# Patient Record
Sex: Male | Born: 1950 | Race: Asian | Hispanic: No | Marital: Married | Smoking: Never smoker
Health system: Southern US, Community
[De-identification: ages and names within clinical notes are randomized; demographics above are authoritative.]

## PROBLEM LIST (undated history)

## (undated) DIAGNOSIS — I10 Essential (primary) hypertension: Secondary | ICD-10-CM

---

## 2002-12-16 ENCOUNTER — Emergency Department (HOSPITAL_COMMUNITY): Admission: EM | Admit: 2002-12-16 | Discharge: 2002-12-16 | Payer: Self-pay | Admitting: Emergency Medicine

## 2006-09-17 ENCOUNTER — Emergency Department (HOSPITAL_COMMUNITY): Admission: EM | Admit: 2006-09-17 | Discharge: 2006-09-18 | Payer: Self-pay | Admitting: Emergency Medicine

## 2010-11-02 LAB — I-STAT 8, (EC8 V) (CONVERTED LAB)
BUN: 20
Bicarbonate: 24.3 — ABNORMAL HIGH
Chloride: 105
Glucose, Bld: 114 — ABNORMAL HIGH
HCT: 43
Hemoglobin: 14.6
Operator id: 277751
Potassium: 3.9
Sodium: 138
TCO2: 25
pCO2, Ven: 38.2 — ABNORMAL LOW
pH, Ven: 7.412 — ABNORMAL HIGH

## 2010-11-02 LAB — URINALYSIS, ROUTINE W REFLEX MICROSCOPIC
Bilirubin Urine: NEGATIVE
Glucose, UA: NEGATIVE
Hgb urine dipstick: NEGATIVE
Ketones, ur: NEGATIVE
Nitrite: NEGATIVE
Protein, ur: NEGATIVE
Specific Gravity, Urine: 1.015
Urobilinogen, UA: 0.2
pH: 6.5

## 2010-11-02 LAB — DIFFERENTIAL
Basophils Absolute: 0
Basophils Relative: 0
Eosinophils Absolute: 0.1
Eosinophils Relative: 1
Lymphocytes Relative: 12
Lymphs Abs: 1.1
Monocytes Absolute: 0.5
Monocytes Relative: 5
Neutro Abs: 8.1 — ABNORMAL HIGH
Neutrophils Relative %: 83 — ABNORMAL HIGH

## 2010-11-02 LAB — CBC
HCT: 39.5
Hemoglobin: 13.8
MCHC: 34.9
MCV: 85.9
Platelets: 209
RBC: 4.6
RDW: 12.7
WBC: 9.8

## 2010-11-02 LAB — POCT I-STAT CREATININE
Creatinine, Ser: 0.9
Operator id: 277751

## 2011-01-18 ENCOUNTER — Other Ambulatory Visit: Payer: Self-pay | Admitting: Internal Medicine

## 2011-01-18 ENCOUNTER — Ambulatory Visit
Admission: RE | Admit: 2011-01-18 | Discharge: 2011-01-18 | Disposition: A | Discharge status: Home / Self Care | Payer: Worker's Compensation | Source: Ambulatory Visit | Attending: Internal Medicine | Admitting: Internal Medicine

## 2011-01-18 DIAGNOSIS — R609 Edema, unspecified: Secondary | ICD-10-CM

## 2014-07-19 ENCOUNTER — Ambulatory Visit: Payer: Worker's Compensation | Admitting: Physician Assistant

## 2014-07-20 ENCOUNTER — Telehealth: Payer: Self-pay | Admitting: Physician Assistant

## 2014-07-20 ENCOUNTER — Encounter: Payer: Self-pay | Admitting: Physician Assistant

## 2014-07-20 NOTE — Telephone Encounter (Signed)
Pt was no show 07/19/14 2:30pm, new pt appt, pt has not rescheduled, mailing letter, charge?

## 2014-07-20 NOTE — Telephone Encounter (Signed)
Do not charge. Do not reschedule with this provider due to NS for NP appointment.

## 2019-05-14 ENCOUNTER — Emergency Department (HOSPITAL_COMMUNITY): Payer: Medicare Other

## 2019-05-14 ENCOUNTER — Encounter (HOSPITAL_COMMUNITY): Payer: Self-pay

## 2019-05-14 ENCOUNTER — Emergency Department (HOSPITAL_COMMUNITY)
Admission: EM | Admit: 2019-05-14 | Discharge: 2019-05-14 | Disposition: A | Discharge status: Home / Self Care | Payer: Medicare Other | Attending: Emergency Medicine | Admitting: Emergency Medicine

## 2019-05-14 DIAGNOSIS — M62838 Other muscle spasm: Secondary | ICD-10-CM | POA: Insufficient documentation

## 2019-05-14 DIAGNOSIS — M549 Dorsalgia, unspecified: Secondary | ICD-10-CM | POA: Diagnosis present

## 2019-05-14 DIAGNOSIS — M542 Cervicalgia: Secondary | ICD-10-CM | POA: Insufficient documentation

## 2019-05-14 DIAGNOSIS — I10 Essential (primary) hypertension: Secondary | ICD-10-CM | POA: Diagnosis not present

## 2019-05-14 DIAGNOSIS — M62830 Muscle spasm of back: Secondary | ICD-10-CM

## 2019-05-14 HISTORY — DX: Essential (primary) hypertension: I10

## 2019-05-14 MED ORDER — DICLOFENAC SODIUM 1 % EX GEL: 2.0000 g | Freq: Four times a day (QID) | CUTANEOUS | 0 refills | Status: AC

## 2019-05-14 NOTE — ED Provider Notes (Signed)
[facility]-EMERGENCY DEPT Provider Note   CSN: [id number] Arrival date & time: 05/14/19  1112     History Chief Complaint  Patient presents with  . Back Pain    Allen Edwards is a 69 y.o. male.  69 year old male presents with complaint of right upper back pain onset 2 days ago upon waking.  Patient states pain is worse with leaning over to pick something up or turning his head to the left or right as well as movement of the right shoulder.  Patient took Motrin and applied some sort of patch to his upper back with improvement in his symptoms today.  Patient states he still has some discomfort when he turns his head left or right.  Denies chest pain, dizziness, extremity weakness or numbness.  No history of falls or injuries.  No other complaints or concerns.        Past Medical History:  Diagnosis Date  . Hypertension     There are no problems to display for this patient.   History reviewed. No pertinent surgical history.     History reviewed. No pertinent family history.  Social History   Tobacco Use  . Smoking status: Never Smoker  . Smokeless tobacco: Never Used  Substance Use Topics  . Alcohol use: Never  . Drug use: Never    Home Medications Prior to Admission medications   Medication Sig Start Date End Date Taking? Authorizing Provider  diclofenac Sodium (VOLTAREN) 1 % GEL Apply 2 g topically 4 (four) times daily for 10 days. 05/14/19 05/24/19  Jeannie Fend, PA-C    Allergies    Patient has no known allergies.  Review of Systems   Review of Systems  Constitutional: Negative for fever.  Respiratory: Negative for shortness of breath.   Cardiovascular: Negative for chest pain.  Musculoskeletal: Positive for back pain, myalgias and neck pain. Negative for arthralgias, gait problem and joint swelling.  Skin: Negative for rash and wound.  Neurological: Negative for weakness and numbness.    Physical Exam Updated Vital Signs BP (!)  175/101 (BP Location: Right Arm)   Pulse 64   Temp 98.2 F (36.8 C) (Oral)   Resp 18   SpO2 96%   Physical Exam Vitals and nursing note reviewed.  Constitutional:      General: He is not in acute distress.    Appearance: He is well-developed. He is not diaphoretic.  HENT:     Head: Normocephalic and atraumatic.  Cardiovascular:     Pulses: Normal pulses.  Pulmonary:     Effort: Pulmonary effort is normal.  Musculoskeletal:        General: Tenderness present. No swelling, deformity or signs of injury. Normal range of motion.     Cervical back: Normal range of motion. Tenderness present. No bony tenderness.     Thoracic back: Tenderness present. No bony tenderness.       Back:     Comments: TTP right paraspinous extending along medial boarder of the right scapula.   Skin:    General: Skin is warm and dry.     Capillary Refill: Capillary refill takes less than 2 seconds.     Findings: No erythema or rash.  Neurological:     Mental Status: He is alert and oriented to person, place, and time.     Sensory: Sensation is intact.     Motor: No weakness.  Psychiatric:        Behavior: Behavior normal.  ED Results / Procedures / Treatments   Labs (all labs ordered are listed, but only abnormal results are displayed) Labs Reviewed - No data to display  EKG None  Radiology DG Cervical Spine Complete  Result Date: 05/14/2019 CLINICAL DATA:  Posterior neck pain. No known injury. EXAM: CERVICAL SPINE - COMPLETE 4+ VIEW COMPARISON:  None. FINDINGS: Normal alignment of the cervical vertebral bodies. No acute bony findings or abnormal prevertebral soft tissue swelling. Mild degenerative disc disease and facet disease. The C1-2 articulations are maintained. The lung apices are clear. IMPRESSION: Normal alignment and no acute bony findings. Mild degenerative changes. Electronically Signed   By: Marijo Sanes M.D.   On: 05/14/2019 13:05    Procedures Procedures (including critical  care time)  Medications Ordered in ED Medications - No data to display  ED Course  I have reviewed the triage vital signs and the nursing notes.  Pertinent labs & imaging results that were available during my care of the patient were reviewed by me and considered in my medical decision making (see chart for details).  Clinical Course as of May 13 1332  Fri May 14, 2711  [id number] 69 year old male with complaint of pain in the right upper back x2 days without injury.  On exam has tenderness to right paraspinous extending into medial border of right scapula.  No arm weakness or numbness.  C-spine x-ray shows mild degenerative changes.  Will recommend patient continue on course of NSAIDs- will change from PO to topical Voltaren due to history of HTN, warm compresses, gentle stretching and follow-up with PCP.   [LM]    Clinical Course User Index [LM] Roque Lias   MDM Rules/Calculators/A&P                      Final Clinical Impression(s) / ED Diagnoses Final diagnoses:  Muscle spasm of back    Rx / DC Orders ED Discharge Orders         Ordered    diclofenac Sodium (VOLTAREN) 1 % GEL  4 times daily     05/14/19 1333           Roque Lias 05/14/19 1335    Sherwood Gambler, MD 05/18/19 [id number]

## 2019-05-14 NOTE — ED Triage Notes (Signed)
Pt presents with c/o upper right back pain for 3 days. Pt denies any injury.

## 2019-05-14 NOTE — Discharge Instructions (Addendum)
Follow up with your doctor- address and phone number added to discharge instructions today. STOP the Motrin by mouth due to history of hypertension. Recommend Topical Voltaren, given prescription for this. Also recommend warm compresses to area for 20 minutes three times daily.

## 2021-10-05 IMAGING — CR DG CERVICAL SPINE COMPLETE 4+V
8 series · 8 of 8 positions shown · non-contrast
Comparison: None.

CLINICAL DATA: Posterior neck pain. No known injury.

EXAM:
CERVICAL SPINE - COMPLETE 4+ VIEW

[w cervical spine lat (1 of 2)]
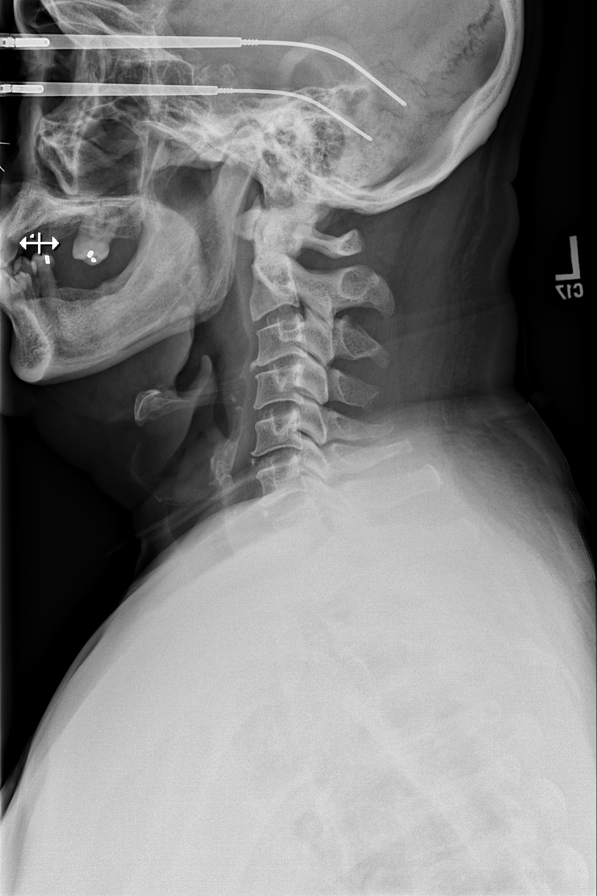

[w cervical spine lat (2 of 2)]
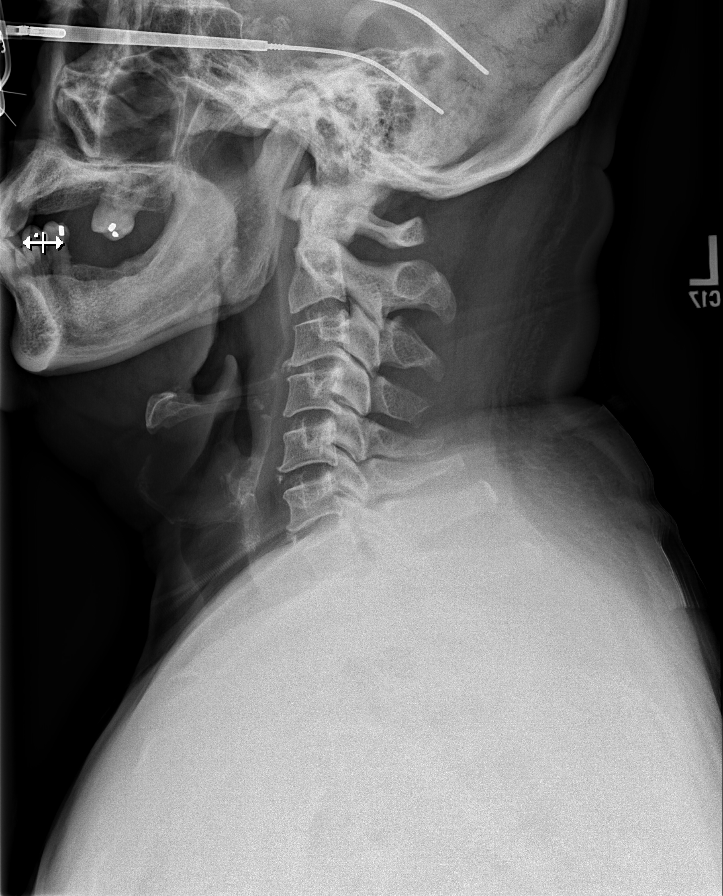

[w cervical spine ap_obl (1 of 3)]
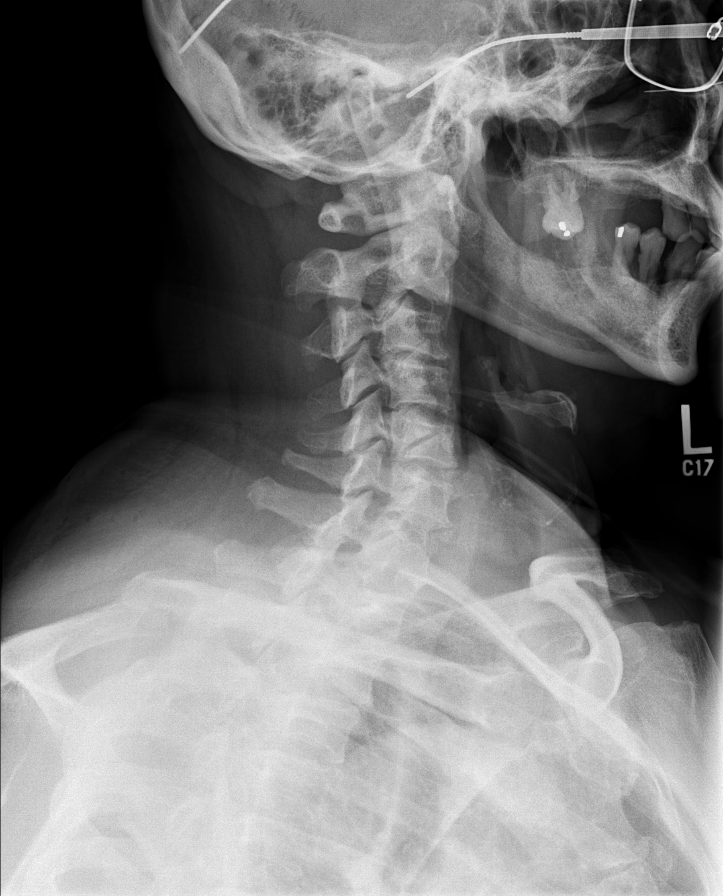

[w cervical spine ap_obl (2 of 3)]
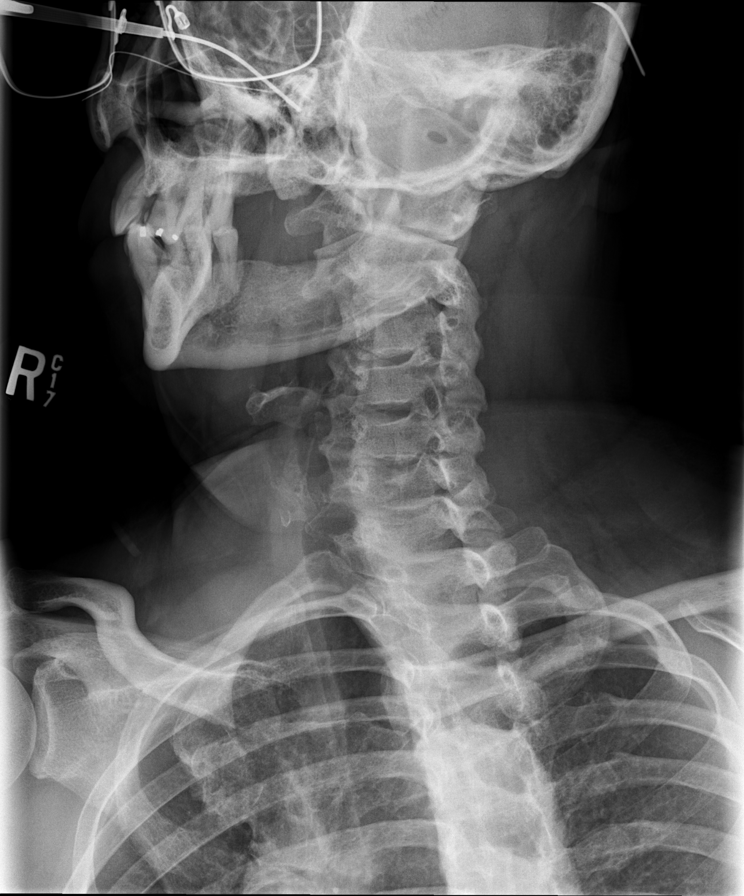

[w cervical spine ap_obl (3 of 3)]
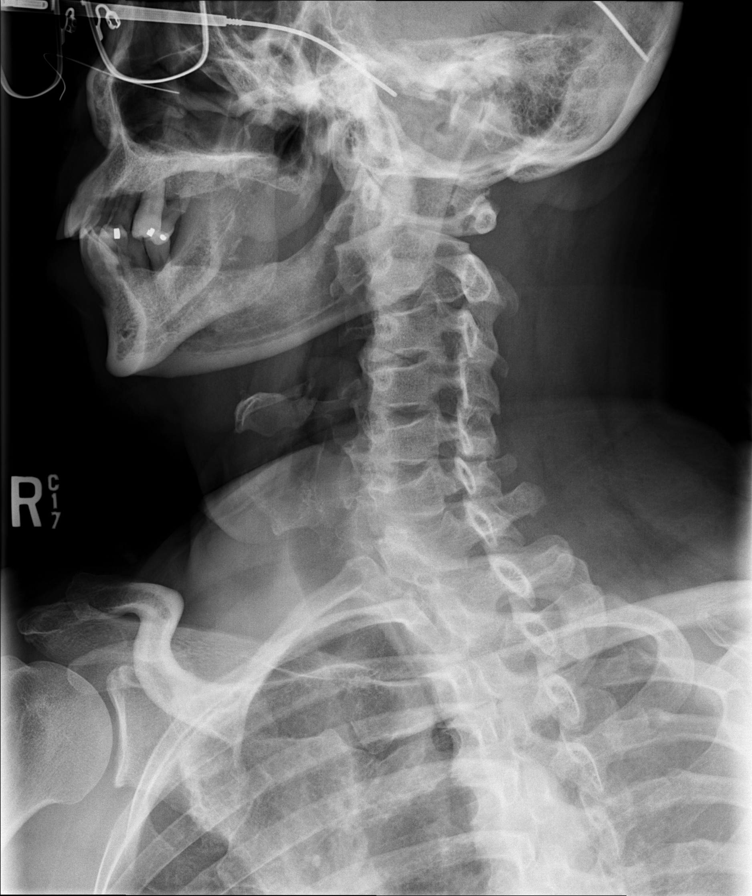

[w cervical spine ap]
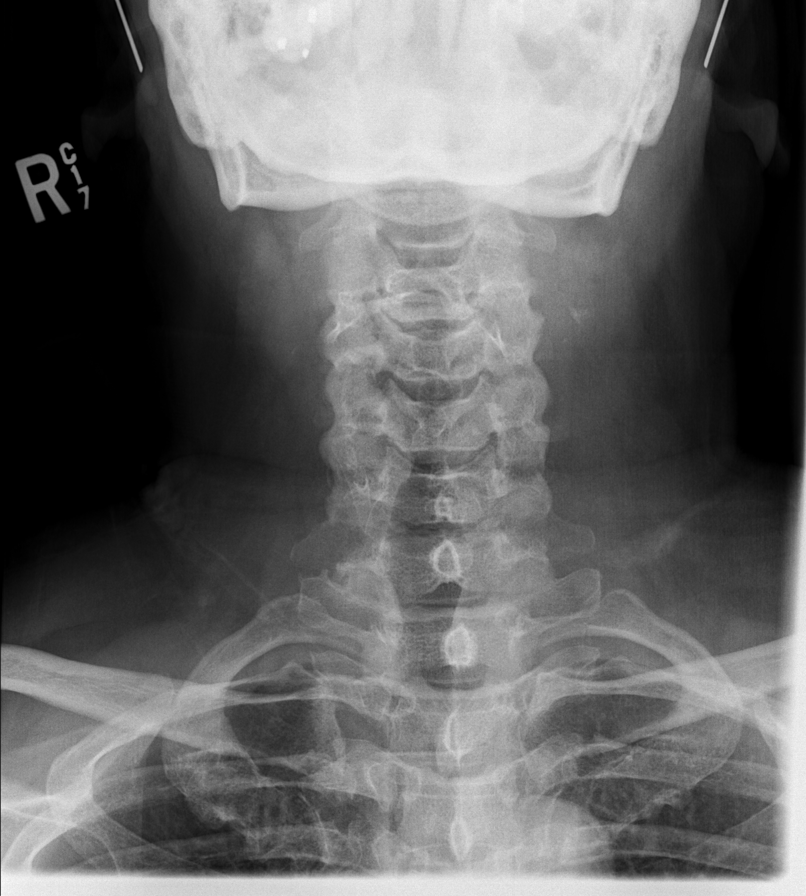

[w cervical spine odontoid]
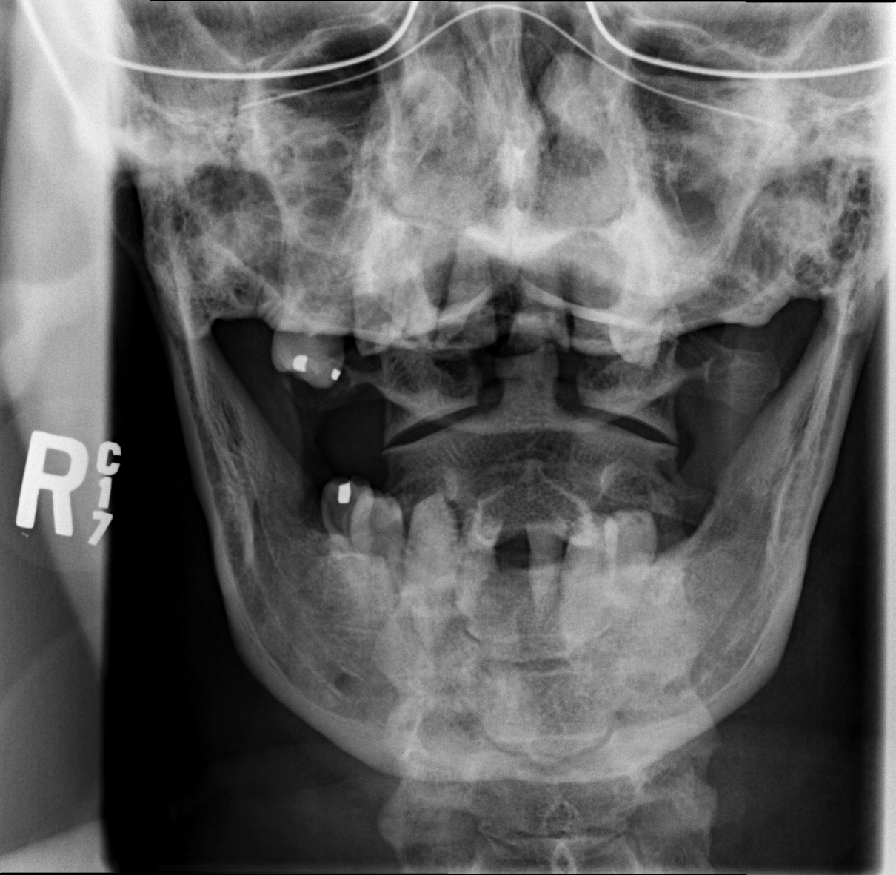

[w cervical swimmers]
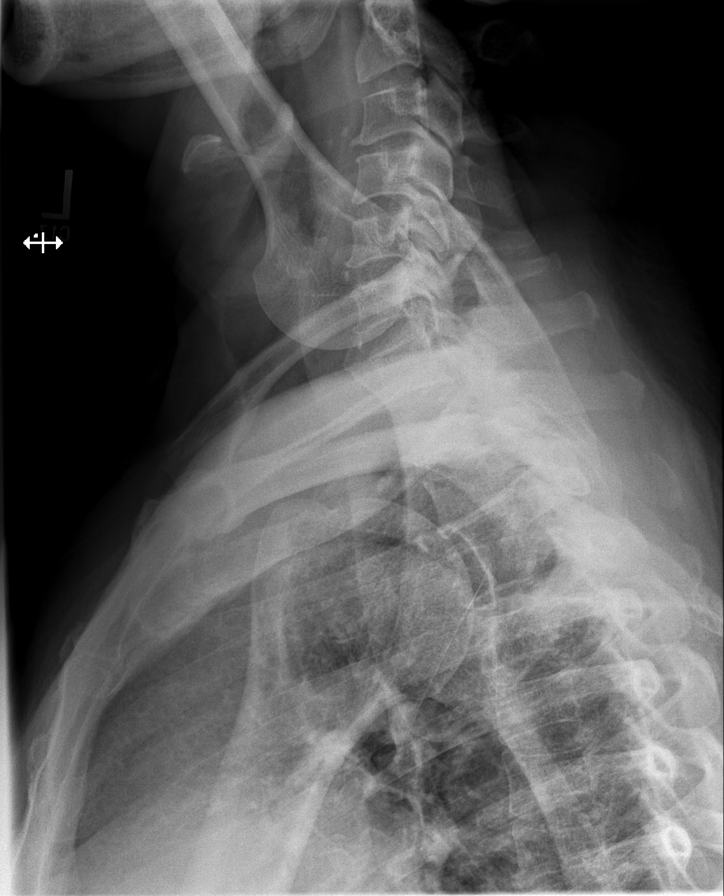

[8 of 8 positions shown; findings below may reference images not displayed]

FINDINGS: Normal alignment of the cervical vertebral bodies. No acute bony
findings or abnormal prevertebral soft tissue swelling.

Mild degenerative disc disease and facet disease.

The C1-2 articulations are maintained. The lung apices are clear.
IMPRESSION: Normal alignment and no acute bony findings.

Mild degenerative changes.

## 2022-07-14 ENCOUNTER — Encounter (HOSPITAL_COMMUNITY): Payer: Self-pay

## 2022-07-14 ENCOUNTER — Emergency Department (HOSPITAL_COMMUNITY)
Admission: EM | Admit: 2022-07-14 | Discharge: 2022-07-14 | Disposition: A | Discharge status: Home / Self Care | Payer: Medicare Other | Attending: Emergency Medicine | Admitting: Emergency Medicine

## 2022-07-14 ENCOUNTER — Other Ambulatory Visit: Payer: Self-pay

## 2022-07-14 DIAGNOSIS — I10 Essential (primary) hypertension: Secondary | ICD-10-CM | POA: Diagnosis not present

## 2022-07-14 DIAGNOSIS — Z23 Encounter for immunization: Secondary | ICD-10-CM | POA: Insufficient documentation

## 2022-07-14 DIAGNOSIS — S0183XA Puncture wound without foreign body of other part of head, initial encounter: Secondary | ICD-10-CM | POA: Diagnosis present

## 2022-07-14 MED ORDER — AMOXICILLIN-POT CLAVULANATE 875-125 MG PO TABS: 1.0000 | ORAL_TABLET | Freq: Two times a day (BID) | ORAL | 0 refills | Status: AC

## 2022-07-14 MED ORDER — OXYCODONE HCL 5 MG PO TABS
5.0000 mg | ORAL_TABLET | Freq: Once | ORAL | Status: AC
  Administered 2022-07-14: 5 mg via ORAL
  Filled 2022-07-14: qty 1

## 2022-07-14 MED ORDER — ACETAMINOPHEN 500 MG PO TABS
1000.0000 mg | ORAL_TABLET | Freq: Once | ORAL | Status: AC
  Administered 2022-07-14: 1000 mg via ORAL
  Filled 2022-07-14: qty 2

## 2022-07-14 MED ORDER — TETANUS-DIPHTH-ACELL PERTUSSIS 5-2.5-18.5 LF-MCG/0.5 IM SUSY
0.5000 mL | PREFILLED_SYRINGE | Freq: Once | INTRAMUSCULAR | Status: AC
  Administered 2022-07-14: 0.5 mL via INTRAMUSCULAR
  Filled 2022-07-14: qty 0.5

## 2022-07-14 MED ORDER — LIDOCAINE-EPINEPHRINE (PF) 2 %-1:200000 IJ SOLN
10.0000 mL | Freq: Once | INTRAMUSCULAR | Status: AC
  Administered 2022-07-14: 10 mL via INTRADERMAL
  Filled 2022-07-14: qty 20

## 2022-07-14 NOTE — Discharge Instructions (Addendum)
Your blood pressure was evaded here.  Please mention this to your family doctor.  Have them check it in the office when you are not in a very stressful situation.  Return for redness drainage or if you get a fever.  The sutures that were used are dissolvable that should dissolve between day 3 and day 5.  If they are still there after day 7 then you can gently plucked them out with tweezers.  The area can get wet but not fully immersed underwater.  No scrubbing.  If you really want to clean it you can apply a half-and-half hydrogen peroxide solution with water on a Q-tip.  You can apply an ointment a couple times a day this could be as simple as Vaseline but could also be an antibiotic ointment if you wish.  Once it is healed please try to avoid prolonged sun exposure use sunscreen.  Gells that have silicone antigens have been shown to reduce scarring in some research.

## 2022-07-14 NOTE — ED Notes (Signed)
Discharge instructions reviewed with patient. Patient questions answered and opportunity for education reviewed. Patient voices understanding of discharge instructions with no further questions. Patient ambulatory with steady gait to lobby.  
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
## 2022-07-22 ENCOUNTER — Emergency Department (HOSPITAL_COMMUNITY)
Admission: EM | Admit: 2022-07-22 | Discharge: 2022-07-22 | Discharge status: Against Medical Advice | Payer: 59 | Attending: Emergency Medicine | Admitting: Emergency Medicine

## 2022-07-22 DIAGNOSIS — S01511D Laceration without foreign body of lip, subsequent encounter: Secondary | ICD-10-CM | POA: Diagnosis not present

## 2022-07-22 DIAGNOSIS — Z4802 Encounter for removal of sutures: Secondary | ICD-10-CM | POA: Diagnosis not present

## 2022-07-22 DIAGNOSIS — Z5321 Procedure and treatment not carried out due to patient leaving prior to being seen by health care provider: Secondary | ICD-10-CM | POA: Diagnosis not present

## 2022-07-22 DIAGNOSIS — X58XXXD Exposure to other specified factors, subsequent encounter: Secondary | ICD-10-CM | POA: Insufficient documentation

## 2022-07-22 NOTE — ED Notes (Signed)
Patient called for room x3. No response

## 2022-07-22 NOTE — ED Triage Notes (Signed)
Pt to ED for stitch removal, reports stitches placed x 1 week ago to left upper lip

## 2022-09-04 DIAGNOSIS — I10 Essential (primary) hypertension: Secondary | ICD-10-CM | POA: Diagnosis not present

## 2022-10-30 DIAGNOSIS — Z0189 Encounter for other specified special examinations: Secondary | ICD-10-CM | POA: Diagnosis not present

## 2022-10-30 DIAGNOSIS — I10 Essential (primary) hypertension: Secondary | ICD-10-CM | POA: Diagnosis not present

## 2022-10-30 DIAGNOSIS — Z0000 Encounter for general adult medical examination without abnormal findings: Secondary | ICD-10-CM | POA: Diagnosis not present

## 2022-11-06 DIAGNOSIS — I10 Essential (primary) hypertension: Secondary | ICD-10-CM | POA: Diagnosis not present

## 2022-11-06 DIAGNOSIS — E1169 Type 2 diabetes mellitus with other specified complication: Secondary | ICD-10-CM | POA: Diagnosis not present

## 2022-11-06 DIAGNOSIS — M79643 Pain in unspecified hand: Secondary | ICD-10-CM | POA: Diagnosis not present

## 2022-11-06 DIAGNOSIS — Z0000 Encounter for general adult medical examination without abnormal findings: Secondary | ICD-10-CM | POA: Diagnosis not present
